# Patient Record
Sex: Female | Born: 1970 | Race: White | Hispanic: No | Marital: Single | Smoking: Current every day smoker
Health system: Southern US, Community
[De-identification: ages and names within clinical notes are randomized; demographics above are authoritative.]

## PROBLEM LIST (undated history)

## (undated) DIAGNOSIS — C4490 Unspecified malignant neoplasm of skin, unspecified: Secondary | ICD-10-CM

## (undated) HISTORY — PX: CHOLECYSTECTOMY: SHX55

## (undated) HISTORY — PX: TUBAL LIGATION: SHX77

## (undated) HISTORY — PX: ROTATOR CUFF REPAIR: SHX139

## (undated) HISTORY — PX: CERVICAL FUSION: SHX112

---

## 2019-10-06 ENCOUNTER — Encounter (HOSPITAL_COMMUNITY): Payer: Self-pay

## 2019-10-06 ENCOUNTER — Emergency Department (HOSPITAL_COMMUNITY)
Admission: EM | Admit: 2019-10-06 | Discharge: 2019-10-06 | Disposition: A | Discharge status: Home / Self Care | Payer: Self-pay | Attending: Emergency Medicine | Admitting: Emergency Medicine

## 2019-10-06 ENCOUNTER — Emergency Department (HOSPITAL_COMMUNITY): Payer: Self-pay

## 2019-10-06 ENCOUNTER — Other Ambulatory Visit: Payer: Self-pay

## 2019-10-06 DIAGNOSIS — F17290 Nicotine dependence, other tobacco product, uncomplicated: Secondary | ICD-10-CM | POA: Insufficient documentation

## 2019-10-06 DIAGNOSIS — K5790 Diverticulosis of intestine, part unspecified, without perforation or abscess without bleeding: Secondary | ICD-10-CM

## 2019-10-06 DIAGNOSIS — D3502 Benign neoplasm of left adrenal gland: Secondary | ICD-10-CM

## 2019-10-06 DIAGNOSIS — N200 Calculus of kidney: Secondary | ICD-10-CM

## 2019-10-06 HISTORY — DX: Unspecified malignant neoplasm of skin, unspecified: C44.90

## 2019-10-06 LAB — COMPREHENSIVE METABOLIC PANEL
ALT: 16 U/L (ref 0–44)
AST: 17 U/L (ref 15–41)
Albumin: 3.5 g/dL (ref 3.5–5.0)
Alkaline Phosphatase: 64 U/L (ref 38–126)
Anion gap: 9 (ref 5–15)
BUN: 10 mg/dL (ref 6–20)
CO2: 23 mmol/L (ref 22–32)
Calcium: 9.2 mg/dL (ref 8.9–10.3)
Chloride: 106 mmol/L (ref 98–111)
Creatinine, Ser: 0.81 mg/dL (ref 0.44–1.00)
GFR calc Af Amer: >60 mL/min (ref >60)
GFR calc non Af Amer: >60 mL/min (ref >60)
Glucose, Bld: 110 mg/dL — ABNORMAL HIGH (ref 70–99)
Potassium: 3.7 mmol/L (ref 3.5–5.1)
Sodium: 138 mmol/L (ref 135–145)
Total Bilirubin: 0.9 mg/dL (ref 0.3–1.2)
Total Protein: 7.2 g/dL (ref 6.5–8.1)

## 2019-10-06 LAB — URINALYSIS, ROUTINE W REFLEX MICROSCOPIC
Bilirubin Urine: NEGATIVE
Glucose, UA: NEGATIVE mg/dL
Ketones, ur: NEGATIVE mg/dL
Nitrite: NEGATIVE
Protein, ur: NEGATIVE mg/dL
Specific Gravity, Urine: 1.004 — ABNORMAL LOW (ref 1.005–1.030)
pH: 6 (ref 5.0–8.0)

## 2019-10-06 LAB — CBC WITH DIFFERENTIAL/PLATELET
Abs Immature Granulocytes: 0.01 10*3/uL (ref 0.00–0.07)
Basophils Absolute: 0 10*3/uL (ref 0.0–0.1)
Basophils Relative: 1 %
Eosinophils Absolute: 0.3 10*3/uL (ref 0.0–0.5)
Eosinophils Relative: 5 %
HCT: 40.2 % (ref 36.0–46.0)
Hemoglobin: 12.8 g/dL (ref 12.0–15.0)
Immature Granulocytes: 0 %
Lymphocytes Relative: 40 %
Lymphs Abs: 2.4 10*3/uL (ref 0.7–4.0)
MCH: 25.4 pg — ABNORMAL LOW (ref 26.0–34.0)
MCHC: 31.8 g/dL (ref 30.0–36.0)
MCV: 79.9 fL — ABNORMAL LOW (ref 80.0–100.0)
Monocytes Absolute: 0.4 10*3/uL (ref 0.1–1.0)
Monocytes Relative: 7 %
Neutro Abs: 2.9 10*3/uL (ref 1.7–7.7)
Neutrophils Relative %: 47 %
Platelets: 292 10*3/uL (ref 150–400)
RBC: 5.03 MIL/uL (ref 3.87–5.11)
RDW: 13.6 % (ref 11.5–15.5)
WBC: 6.1 10*3/uL (ref 4.0–10.5)
nRBC: 0 % (ref 0.0–0.2)

## 2019-10-06 LAB — I-STAT BETA HCG BLOOD, ED (MC, WL, AP ONLY): I-stat hCG, quantitative: <5 m[IU]/mL (ref <5)

## 2019-10-06 LAB — LIPASE, BLOOD: Lipase: 23 U/L (ref 11–51)

## 2019-10-06 MED ORDER — TAMSULOSIN HCL 0.4 MG PO CAPS
0.4000 mg | ORAL_CAPSULE | Freq: Every day | ORAL | Status: DC
  Administered 2019-10-06: 0.4 mg via ORAL
  Filled 2019-10-06: qty 1

## 2019-10-06 MED ORDER — TAMSULOSIN HCL 0.4 MG PO CAPS: 0.4000 mg | ORAL_CAPSULE | Freq: Every day | ORAL | 0 refills | Status: DC

## 2019-10-06 MED ORDER — HYDROCODONE-ACETAMINOPHEN 5-325 MG PO TABS: 1.0000 | ORAL_TABLET | Freq: Four times a day (QID) | ORAL | 0 refills | Status: DC | PRN

## 2019-10-06 MED ORDER — ONDANSETRON 4 MG PO TBDP: 4.0000 mg | ORAL_TABLET | Freq: Three times a day (TID) | ORAL | 0 refills | Status: AC | PRN

## 2019-10-06 MED ORDER — SODIUM CHLORIDE 0.9% FLUSH: 3.0000 mL | Freq: Once | INTRAVENOUS | Status: DC

## 2019-10-06 MED ORDER — ONDANSETRON HCL 4 MG/2ML IJ SOLN
4.0000 mg | Freq: Once | INTRAMUSCULAR | Status: AC
  Administered 2019-10-06: 4 mg via INTRAVENOUS
  Filled 2019-10-06: qty 2

## 2019-10-06 MED ORDER — SODIUM CHLORIDE 0.9 % IV SOLN
1.0000 g | Freq: Once | INTRAVENOUS | Status: AC
  Administered 2019-10-06: 13:00:00 1 g via INTRAVENOUS
  Filled 2019-10-06: qty 10

## 2019-10-06 MED ORDER — MORPHINE SULFATE (PF) 4 MG/ML IV SOLN
4.0000 mg | Freq: Once | INTRAVENOUS | Status: AC
  Administered 2019-10-06: 4 mg via INTRAVENOUS
  Filled 2019-10-06: qty 1

## 2019-10-06 MED ORDER — KETOROLAC TROMETHAMINE 15 MG/ML IJ SOLN
15.0000 mg | Freq: Once | INTRAMUSCULAR | Status: AC
  Administered 2019-10-06: 15 mg via INTRAVENOUS
  Filled 2019-10-06: qty 1

## 2019-10-06 MED ORDER — SULFAMETHOXAZOLE-TRIMETHOPRIM 800-160 MG PO TABS: 1.0000 | ORAL_TABLET | Freq: Two times a day (BID) | ORAL | 0 refills | Status: AC

## 2019-10-06 MED ORDER — OXYCODONE-ACETAMINOPHEN 5-325 MG PO TABS
1.0000 | ORAL_TABLET | Freq: Once | ORAL | Status: AC
  Administered 2019-10-06: 1 via ORAL
  Filled 2019-10-06: qty 1

## 2019-10-06 NOTE — ED Provider Notes (Signed)
[facility] DEPT Provider Note   CSN: [id number] Arrival date & time: 10/06/19  0755     History Chief Complaint  Patient presents with  . Abdominal Pain    Abigail Dixon is a 49 y.o. female presents today with left flank and left lower quadrant pain onset 1 AM today.  Pain began gradually as worsened since that time, has remained severe since 4 AM this morning.  Pain is sharp radiating left flank down towards her groin constant no clear aggravating or alleviating factors and 10/10 in intensity.  She denies history of similar in the past.  Associated symptom of nausea without vomiting, additionally reports urinary frequency.  Denies fever/chills, headache, chest pain/shortness of breath, vomiting, diarrhea, dysuria/hematuria, vaginal bleeding/discharge, concern for STI, numbness/tingling, weakness or any additional concerns. HPI     Past Medical History:  Diagnosis Date  . Skin cancer     There are no problems to display for this patient.   Past Surgical History:  Procedure Laterality Date  . CHOLECYSTECTOMY    . TUBAL LIGATION       OB History   No obstetric history on file.     History reviewed. No pertinent family history.  Social History   Tobacco Use  . Smoking status: Current Every Day Smoker    Types: E-cigarettes  . Smokeless tobacco: Never Used  Substance Use Topics  . Alcohol use: Never  . Drug use: Never    Home Medications Prior to Admission medications   Medication Sig Start Date End Date Taking? Authorizing Provider  ibuprofen (ADVIL) 800 MG tablet Take 800 mg by mouth every 8 (eight) hours as needed for headache or moderate pain.   Yes [provider]  HYDROcodone-acetaminophen (NORCO/VICODIN) 5-325 MG tablet Take 1-2 tablets by mouth every 6 (six) hours as needed. 10/06/19   Nuala Alpha A, PA-C  ondansetron (ZOFRAN ODT) 4 MG disintegrating tablet Take 1 tablet (4 mg total) by mouth every 8 (eight)  hours as needed for up to 3 days for nausea or vomiting. 10/06/19 10/09/19  Nuala Alpha A, PA-C  sulfamethoxazole-trimethoprim (BACTRIM DS) 800-160 MG tablet Take 1 tablet by mouth 2 (two) times daily for 7 days. 10/06/19 10/13/19  Nuala Alpha A, PA-C  tamsulosin (FLOMAX) 0.4 MG CAPS capsule Take 1 capsule (0.4 mg total) by mouth daily. 10/06/19   Deliah Boston, PA-C    Allergies    Patient has no known allergies.  Review of Systems   Review of Systems Ten systems are reviewed and are negative for acute change except as noted in the HPI  Physical Exam Updated Vital Signs BP 117/70   Pulse 80   Temp 97.8 F (36.6 C) (Oral)   Resp 18   Ht 5\' 5"  (1.651 m)   Wt 106.6 kg   SpO2 100%   BMI 39.11 kg/m   Physical Exam Constitutional:      General: She is not in acute distress.    Appearance: Normal appearance. She is well-developed. She is not ill-appearing or diaphoretic.  HENT:     Head: Normocephalic and atraumatic.     Right Ear: External ear normal.     Left Ear: External ear normal.     Nose: Nose normal.  Eyes:     General: Vision grossly intact. Gaze aligned appropriately.     Pupils: Pupils are equal, round, and reactive to light.  Neck:     Trachea: Trachea and phonation normal. No tracheal deviation.  Cardiovascular:  Pulses:          Dorsalis pedis pulses are 2+ on the right side and 2+ on the left side.  Pulmonary:     Effort: Pulmonary effort is normal. No respiratory distress.  Abdominal:     General: There is no distension.     Palpations: Abdomen is soft.     Tenderness: There is abdominal tenderness in the left lower quadrant. There is left CVA tenderness. There is no right CVA tenderness, guarding or rebound.  Genitourinary:    Comments: Deferred by patient Musculoskeletal:        General: Normal range of motion.     Cervical back: Normal range of motion.     Comments: No midline C/T/L spinal tenderness to palpation, no deformity, crepitus, or  step-off noted. No sign of injury to the neck or back.  Feet:     Right foot:     Protective Sensation: 3 sites tested. 3 sites sensed.     Left foot:     Protective Sensation: 3 sites tested. 3 sites sensed.  Skin:    General: Skin is warm and dry.  Neurological:     Mental Status: She is alert.     GCS: GCS eye subscore is 4. GCS verbal subscore is 5. GCS motor subscore is 6.     Comments: Speech is clear and goal oriented, follows commands Major Cranial nerves without deficit, no facial droop Moves extremities without ataxia, coordination intact  Psychiatric:        Behavior: Behavior normal.     ED Results / Procedures / Treatments   Labs (all labs ordered are listed, but only abnormal results are displayed) Labs Reviewed  COMPREHENSIVE METABOLIC PANEL - Abnormal; Notable for the following components:      Result Value   Glucose, Bld 110 (*)    All other components within normal limits  URINALYSIS, ROUTINE W REFLEX MICROSCOPIC - Abnormal; Notable for the following components:   Color, Urine STRAW (*)    Specific Gravity, Urine 1.004 (*)    Hgb urine dipstick MODERATE (*)    Leukocytes,Ua SMALL (*)    Bacteria, UA MANY (*)    All other components within normal limits  CBC WITH DIFFERENTIAL/PLATELET - Abnormal; Notable for the following components:   MCV 79.9 (*)    MCH 25.4 (*)    All other components within normal limits  URINE CULTURE  LIPASE, BLOOD  I-STAT BETA HCG BLOOD, ED ([facility], [facility], [facility] ONLY)    EKG None  Radiology CT Renal Stone Study  Result Date: 10/06/2019 CLINICAL DATA:  49 year old female with history of flank and abdominal pain, most severe on the left side. Evaluate for urinary tract calculus. EXAM: CT ABDOMEN AND PELVIS WITHOUT CONTRAST TECHNIQUE: Multidetector CT imaging of the abdomen and pelvis was performed following the standard protocol without IV contrast. COMPARISON:  No priors. FINDINGS: Lower chest: Unremarkable. Hepatobiliary: No suspicious  cystic or solid hepatic lesions are confidently identified on today's noncontrast CT examination. Status post cholecystectomy. Pancreas: No definite pancreatic mass or peripancreatic fluid collections or inflammatory changes noted on today's noncontrast CT examination. Spleen: Unremarkable. Adrenals/Urinary Tract: 5 mm calculus in the distal left ureter at the left ureterovesicular junction with mild proximal left hydroureteronephrosis. No additional calculi are identified within the collecting system of either kidney, along the course of the right ureter or within the lumen of the urinary bladder. No right-sided hydroureteronephrosis. Bilateral kidneys are otherwise normal in appearance. Urinary bladder is normal  in appearance. 1.6 cm low-attenuation (0 HU) left adrenal nodule compatible with a small adenoma. Right adrenal gland is normal in appearance. Stomach/Bowel: Normal appearance of the stomach. No pathologic dilatation of small bowel or colon. A few scattered colonic diverticulae are noted, particularly in the sigmoid colon, without surrounding inflammatory changes to suggest an acute diverticulitis at this time. Normal appendix. Vascular/Lymphatic: No atherosclerotic calcifications in the abdominal aorta or pelvic vasculature. No lymphadenopathy noted in the abdomen or pelvis. Reproductive: Uterus and ovaries are unremarkable in appearance. Other: No significant volume of ascites.  No pneumoperitoneum. Musculoskeletal: There are no aggressive appearing lytic or blastic lesions noted in the visualized portions of the skeleton. IMPRESSION: 1. 5 mm calculus in the distal left ureter at the left ureterovesicular junction with mild proximal left hydroureteronephrosis indicating mild obstruction at this time. 2. Small 1.6 cm left adrenal adenoma. 3. Mild colonic diverticulosis without evidence of acute diverticulitis at this time. Electronically Signed   By: Vinnie Langton M.D.   On: 10/06/2019 11:05     Procedures Procedures (including critical care time)  Medications Ordered in ED Medications  sodium chloride flush (NS) 0.9 % injection 3 mL (3 mLs Intravenous Not Given 10/06/19 0918)  cefTRIAXone (ROCEPHIN) 1 g in sodium chloride 0.9 % 100 mL IVPB (1 g Intravenous New Bag/Given 10/06/19 1251)  tamsulosin (FLOMAX) capsule 0.4 mg (has no administration in time range)  morphine 4 MG/ML injection 4 mg (4 mg Intravenous Given 10/06/19 0924)  ondansetron (ZOFRAN) injection 4 mg (4 mg Intravenous Given 10/06/19 0926)  ketorolac (TORADOL) 15 MG/ML injection 15 mg (15 mg Intravenous Given 10/06/19 1059)  oxyCODONE-acetaminophen (PERCOCET/ROXICET) 5-325 MG per tablet 1 tablet (1 tablet Oral Given 10/06/19 1251)    ED Course  I have reviewed the triage vital signs and the nursing notes.  Pertinent labs & imaging results that were available during my care of the patient were reviewed by me and considered in my medical decision making (see chart for details).  Clinical Course as of Oct 06 1318  Thu Oct 06, 2019  1202 Dr. Alyson Ingles [facility]; 1g Rocephin, d/c on Bactrim. Follow-up within 1 week.   [BM]    Clinical Course User Index [BM] Gari Crown   MDM Rules/Calculators/A&P                     49 year old female presents today with left flank and left lower quadrant pain, urinary frequency and nausea.  Symptoms began early this morning and she has never experienced similar before.  On initial evaluation she is uncomfortable appearing, no acute distress, she has left CVA tenderness and left lower quadrant tenderness.  We discussed pelvic examination, patient wishes to be evaluated for possible kidney stone and only wants to proceed with pelvic exam if negative CT scan.  I discussed differential today with patient feel is reasonable to respect her wish and will obtain CT renal stone study at this time.  Pain and nausea medication has been ordered. - Lab work reviewed, pregnancy test  negative.  Lipase within normal limits no evidence of pancreatitis.  CBC without leukocytosis to suggest infection and no sign of anemia.  CMP shows slight elevation of glucose otherwise within normal limits no acute electrolyte derangement, kidney injury or elevation of LFTs.  Urinalysis shows moderate hemoglobin, small leukocytes, 6/10 WBCs and many bacteria questionable for infection.  CT renal stone study showed 5 mm stone at the left UVJ with mild hydronephrosis.  Patient was  reassessed states improvement of pain following morphine and Toradol, nausea improved with Zofran.  Vital signs stable.  CT scan reviewed, per radiologist ovaries and uterus appear unremarkable.  Suspect patient's groin pain is referred from her kidney stone today, pelvic exam was deferred.  Low suspicion for GYN etiology of her symptoms today. - 12:02 PM: I discussed the case with urologist Dr. Alyson Ingles who recommended giving patient 1 g IV Rocephin and discharging on Bactrim and follow-up in his office in the next week. - I reevaluated the patient and updated her on urologist plan of care, she is agreeable.  She has no questions or concerns.  She plans to call office today to schedule follow-up appointment.  Patient prescribed tamsulosin to facilitate stone passage, ODT Zofran for nausea, Norco for pain.  Discussed narcotic precautions with patient and she states understanding, PDMP last narcotic rx was November 2019.  It is reasonable to give patient 6 pills Norco for acute kidney stone pain.  Additionally she was given Toradol and advised to avoid further NSAIDs for the next 2 days, she has no history of CKD or gastric ulcers.  Additionally patient was advised of incidental findings on CT scan today in the follow-up PCP.  At this time there does not appear to be any evidence of an acute emergency medical condition and the patient appears stable for discharge with appropriate outpatient follow up. Diagnosis was discussed with  patient who verbalizes understanding of care plan and is agreeable to discharge. I have discussed return precautions with patient who verbalizes understanding of return precautions. Patient encouraged to follow-up with their PCP and urology. All questions answered.  Patient was seen and evaluated by Dr. Ralene Bathe during this visit who agrees with discharge and outpatient follow-up.  Note: Portions of this report may have been transcribed using voice recognition software. Every effort was made to ensure accuracy; however, inadvertent computerized transcription errors may still be present. Final Clinical Impression(s) / ED Diagnoses Final diagnoses:  Kidney stone on left side  Adenoma of left adrenal gland  Diverticulosis    Rx / DC Orders ED Discharge Orders         Ordered    tamsulosin (FLOMAX) 0.4 MG CAPS capsule  Daily     10/06/19 1259    ondansetron (ZOFRAN ODT) 4 MG disintegrating tablet  Every 8 hours PRN     10/06/19 1259    HYDROcodone-acetaminophen (NORCO/VICODIN) 5-325 MG tablet  Every 6 hours PRN     10/06/19 1300    sulfamethoxazole-trimethoprim (BACTRIM DS) 800-160 MG tablet  2 times daily     10/06/19 1302           Gari Crown 10/06/19 1323    Quintella Reichert, MD 10/07/19 1128

## 2019-10-06 NOTE — ED Triage Notes (Signed)
Pt presents with c/o abdominal pain. Pt reports the pain initially started around 1 am. Pt reports the pain is left lower quadrant, radiating into her vaginal area. Pt reports some nausea, no vomiting.

## 2019-10-06 NOTE — Discharge Instructions (Addendum)
You have been diagnosed today with left-sided kidney stone.  At this time there does not appear to be the presence of an emergent medical condition, however there is always the potential for conditions to change. Please read and follow the below instructions.  Please return to the Emergency Department immediately for any new or worsening symptoms or if your symptoms do not improve within 2 days. Please be sure to follow up with your Primary Care Provider within one week regarding your visit today; please call their office to schedule an appointment even if you are feeling better for a follow-up visit. Please call the urologist Dr. Alyson Ingles in your discharge paperwork today to schedule a follow-up appointment within the next 7 days for recheck.  Please take the antibiotic medication Bactrim as prescribed.  Please take the medication Flomax as prescribed to help facilitate passage of a kidney stone.  You may use the medication Zofran as prescribed to help with nausea.  You may use the medication Norco as prescribed to help with severe pain, please do not drive or perform any dangerous activities while taking Norco as it will make you drowsy.  Do not take any sedating medications or drink alcohol with Norco as this will worsen side effects. You have been given an NSAID-containing medication called Toradol today.  Do not take the medications including ibuprofen, Aleve, Advil, naproxen or other NSAID-containing medications for the next 2 days.  Please be sure to drink plenty of water over the next few days. Your CT scan today showed some incidental findings including left adrenal adenoma and diverticulosis.  Please discuss these findings with your primary care provider at your follow-up appointment.  You may view your results in full on your MyChart account and discuss this with your primary care provider.  Get help right away if: You have a fever or chills. You get very bad pain. You get new pain in your  belly (abdomen). You pass out (faint). You cannot pee. You have any new/concerning or worsening of symptoms   Please read the additional information packets attached to your discharge summary.  Do not take your medicine if  develop an itchy rash, swelling in your mouth or lips, or difficulty breathing; call 911 and seek immediate emergency medical attention if this occurs.  Note: Portions of this text may have been transcribed using voice recognition software. Every effort was made to ensure accuracy; however, inadvertent computerized transcription errors may still be present.

## 2019-10-06 NOTE — ED Notes (Signed)
Notified [place], [place] of patients pain 8/10 post morphine admin.

## 2019-10-09 LAB — URINE CULTURE: Culture: 50000 — AB

## 2019-10-10 ENCOUNTER — Telehealth: Payer: Self-pay | Admitting: Emergency Medicine

## 2019-10-10 NOTE — Telephone Encounter (Signed)
Post ED Visit - Positive Culture Follow-up  Culture report reviewed by antimicrobial stewardship pharmacist: [place] Team []  [facility], Pharm.D. []  Heide Guile, Pharm.D., BCPS AQ-ID []  Parks Neptune, Pharm.D., BCPS []  Alycia Rossetti, Pharm.D., BCPS []  [place], Pharm.D., BCPS, AAHIVP []  Legrand Como, Pharm.D., BCPS, AAHIVP []  Salome Arnt, PharmD, BCPS []  Johnnette Gourd, PharmD, BCPS []  Hughes Better, PharmD, BCPS []  Leeroy Cha, PharmD []  Laqueta Linden, PharmD, BCPS []  Albertina Parr, PharmD  [place] Team []  Leodis Sias, PharmD []  Lindell Spar, PharmD []  Royetta Asal, PharmD []  Graylin Shiver, Rph []  Rema Fendt) Glennon Mac, PharmD []  Arlyn Dunning, PharmD []  Netta Cedars, PharmD []  Dia Sitter, PharmD []  Leone Haven, PharmD []  Gretta Arab, PharmD []  Theodis Shove, PharmD []  Peggyann Juba, PharmD []  Reuel Boom, PharmD   Positive urine culture Treated with sulfamethoxazole-trimethoprim , organism sensitive to the same and no further patient follow-up is required at this time.  Hazle Nordmann 10/10/2019, 12:19 PM

## 2019-10-15 ENCOUNTER — Emergency Department (HOSPITAL_COMMUNITY): Payer: Self-pay

## 2019-10-15 ENCOUNTER — Other Ambulatory Visit: Payer: Self-pay

## 2019-10-15 ENCOUNTER — Emergency Department (HOSPITAL_COMMUNITY)
Admission: EM | Admit: 2019-10-15 | Discharge: 2019-10-15 | Disposition: A | Discharge status: Home / Self Care | Payer: Self-pay | Attending: Emergency Medicine | Admitting: Emergency Medicine

## 2019-10-15 DIAGNOSIS — R1032 Left lower quadrant pain: Secondary | ICD-10-CM | POA: Insufficient documentation

## 2019-10-15 DIAGNOSIS — F17290 Nicotine dependence, other tobacco product, uncomplicated: Secondary | ICD-10-CM | POA: Insufficient documentation

## 2019-10-15 DIAGNOSIS — R1031 Right lower quadrant pain: Secondary | ICD-10-CM | POA: Insufficient documentation

## 2019-10-15 LAB — URINALYSIS, ROUTINE W REFLEX MICROSCOPIC
Bilirubin Urine: NEGATIVE
Glucose, UA: NEGATIVE mg/dL
Ketones, ur: NEGATIVE mg/dL
Nitrite: NEGATIVE
Protein, ur: NEGATIVE mg/dL
Specific Gravity, Urine: 1.002 — ABNORMAL LOW (ref 1.005–1.030)
pH: 6 (ref 5.0–8.0)

## 2019-10-15 LAB — COMPREHENSIVE METABOLIC PANEL
ALT: 16 U/L (ref 0–44)
AST: 16 U/L (ref 15–41)
Albumin: 3.7 g/dL (ref 3.5–5.0)
Alkaline Phosphatase: 63 U/L (ref 38–126)
Anion gap: 6 (ref 5–15)
BUN: 11 mg/dL (ref 6–20)
CO2: 23 mmol/L (ref 22–32)
Calcium: 9.3 mg/dL (ref 8.9–10.3)
Chloride: 110 mmol/L (ref 98–111)
Creatinine, Ser: 0.8 mg/dL (ref 0.44–1.00)
GFR calc Af Amer: >60 mL/min (ref >60)
GFR calc non Af Amer: >60 mL/min (ref >60)
Glucose, Bld: 105 mg/dL — ABNORMAL HIGH (ref 70–99)
Potassium: 3.9 mmol/L (ref 3.5–5.1)
Sodium: 139 mmol/L (ref 135–145)
Total Bilirubin: 0.7 mg/dL (ref 0.3–1.2)
Total Protein: 7.4 g/dL (ref 6.5–8.1)

## 2019-10-15 LAB — CBC WITH DIFFERENTIAL/PLATELET
Abs Immature Granulocytes: 0.01 10*3/uL (ref 0.00–0.07)
Basophils Absolute: 0 10*3/uL (ref 0.0–0.1)
Basophils Relative: 0 %
Eosinophils Absolute: 0.3 10*3/uL (ref 0.0–0.5)
Eosinophils Relative: 6 %
HCT: 40.7 % (ref 36.0–46.0)
Hemoglobin: 12.9 g/dL (ref 12.0–15.0)
Immature Granulocytes: 0 %
Lymphocytes Relative: 40 %
Lymphs Abs: 2.4 10*3/uL (ref 0.7–4.0)
MCH: 25.9 pg — ABNORMAL LOW (ref 26.0–34.0)
MCHC: 31.7 g/dL (ref 30.0–36.0)
MCV: 81.6 fL (ref 80.0–100.0)
Monocytes Absolute: 0.4 10*3/uL (ref 0.1–1.0)
Monocytes Relative: 6 %
Neutro Abs: 2.9 10*3/uL (ref 1.7–7.7)
Neutrophils Relative %: 48 %
Platelets: 358 10*3/uL (ref 150–400)
RBC: 4.99 MIL/uL (ref 3.87–5.11)
RDW: 13.8 % (ref 11.5–15.5)
WBC: 6 10*3/uL (ref 4.0–10.5)
nRBC: 0 % (ref 0.0–0.2)

## 2019-10-15 LAB — WET PREP, GENITAL
Clue Cells Wet Prep HPF POC: NONE SEEN
Sperm: NONE SEEN
Trich, Wet Prep: NONE SEEN
Yeast Wet Prep HPF POC: NONE SEEN

## 2019-10-15 LAB — LIPASE, BLOOD: Lipase: 28 U/L (ref 11–51)

## 2019-10-15 MED ORDER — SODIUM CHLORIDE 0.9 % IV SOLN: Freq: Once | INTRAVENOUS | Status: AC

## 2019-10-15 MED ORDER — MORPHINE SULFATE (PF) 4 MG/ML IV SOLN
4.0000 mg | Freq: Once | INTRAVENOUS | Status: AC
  Administered 2019-10-15: 4 mg via INTRAVENOUS
  Filled 2019-10-15: qty 1

## 2019-10-15 MED ORDER — ONDANSETRON HCL 4 MG/2ML IJ SOLN
4.0000 mg | Freq: Once | INTRAMUSCULAR | Status: AC
  Administered 2019-10-15: 4 mg via INTRAVENOUS
  Filled 2019-10-15: qty 2

## 2019-10-15 MED ORDER — DICYCLOMINE HCL 20 MG PO TABS: 20.0000 mg | ORAL_TABLET | Freq: Two times a day (BID) | ORAL | 0 refills | Status: DC

## 2019-10-15 NOTE — ED Provider Notes (Signed)
[facility] DEPT Provider Note   CSN: [id number] Arrival date & time: 10/15/19  1540     History Chief Complaint  Patient presents with  . Flank Pain    Abigail Dixon is a 49 y.o. female.  The history is provided by the patient and the spouse. No language interpreter was used.  Flank Pain This is a new problem. The problem occurs constantly. The problem has been gradually worsening. Associated symptoms include abdominal pain. Pertinent negatives include no chest pain, no headaches and no shortness of breath. Nothing aggravates the symptoms. The symptoms are relieved by narcotics.   Patient is a 49 year old female with no significant past medical history with a surgical history of cholecystectomy and tubal ligation bilaterally.   Patient states that she was seen 3/4 and diagnosed with ureteral stone and given Flomax, pain medication and follow-up with urology.  She states that she was seen by urology on Thursday and saw Dr. Baxter Flattery who refilled her prescriptions for pain medication, Zofran and Flomax.  She was told to return to ED if she had any new or concerning symptoms.  She states that this morning she felt like she was having a crampy, achy, severe pain in her right lower quadrant which is similar to the pain she was having her left side when she had her ureteral stone.  She states it feels like a menstrual cramp and states that it began this morning with a gradual onset.  She denies any hematuria, endorses some mild nausea currently but denies any vomiting.     Past Medical History:  Diagnosis Date  . Skin cancer     There are no problems to display for this patient.   Past Surgical History:  Procedure Laterality Date  . CHOLECYSTECTOMY    . TUBAL LIGATION       OB History   No obstetric history on file.     No family history on file.  Social History   Tobacco Use  . Smoking status: Current Every Day Smoker    Types: E-cigarettes   . Smokeless tobacco: Never Used  Substance Use Topics  . Alcohol use: Never  . Drug use: Never    Home Medications Prior to Admission medications   Medication Sig Start Date End Date Taking? Authorizing Provider  dicyclomine (BENTYL) 20 MG tablet Take 1 tablet (20 mg total) by mouth 2 (two) times daily. 10/15/19   Tedd Sias, PA  HYDROcodone-acetaminophen (NORCO/VICODIN) 5-325 MG tablet Take 1-2 tablets by mouth every 6 (six) hours as needed. 10/06/19   Nuala Alpha A, PA-C  ibuprofen (ADVIL) 800 MG tablet Take 800 mg by mouth every 8 (eight) hours as needed for headache or moderate pain.    [provider]  tamsulosin (FLOMAX) 0.4 MG CAPS capsule Take 1 capsule (0.4 mg total) by mouth daily. 10/06/19   Deliah Boston, PA-C    Allergies    Patient has no known allergies.  Review of Systems   Review of Systems  Constitutional: Negative for chills and fever.  HENT: Negative for congestion.   Eyes: Negative for pain.  Respiratory: Negative for cough and shortness of breath.   Cardiovascular: Negative for chest pain and leg swelling.  Gastrointestinal: Positive for abdominal pain and nausea. Negative for diarrhea and vomiting.  Genitourinary: Positive for flank pain. Negative for dysuria, pelvic pain, vaginal bleeding and vaginal discharge.  Musculoskeletal: Negative for myalgias.  Skin: Negative for rash.  Neurological: Negative for dizziness and headaches.  Physical Exam Updated Vital Signs BP (!) 149/125 (BP Location: Right Arm)   Pulse 78   Temp 98.8 F (37.1 C) (Oral)   Resp 17   Ht 5\' 5"  (1.651 m)   Wt 106.6 kg   SpO2 99%   BMI 39.11 kg/m   Physical Exam Vitals and nursing note reviewed.  Constitutional:      General: She is in acute distress.     Appearance: She is obese. She is not ill-appearing, toxic-appearing or diaphoretic.     Comments: Patient appears acutely uncomfortable.  HENT:     Head: Normocephalic and atraumatic.     Nose:  Nose normal.     Mouth/Throat:     Mouth: Mucous membranes are dry.  Eyes:     General: No scleral icterus. Cardiovascular:     Rate and Rhythm: Normal rate and regular rhythm.     Pulses: Normal pulses.     Heart sounds: Normal heart sounds.  Pulmonary:     Effort: Pulmonary effort is normal. No respiratory distress.     Breath sounds: No wheezing.  Abdominal:     Palpations: Abdomen is soft.     Tenderness: There is abdominal tenderness (Bilateral quadrant abdominal pain with palpation.). There is no right CVA tenderness, left CVA tenderness, guarding or rebound.     Comments: No CVA tenderness.  No rebound, guarding, masses or hernia.  Genitourinary:    Comments: Vulva without lesions or abnormality Vaginal canal without abnormal discharge or lesion Cervix appears normal, is closed No adnexal tenderness or CMT Musculoskeletal:     Cervical back: Normal range of motion.     Right lower leg: No edema.     Left lower leg: No edema.  Skin:    General: Skin is warm and dry.     Capillary Refill: Capillary refill takes less than 2 seconds.  Neurological:     Mental Status: She is alert. Mental status is at baseline.  Psychiatric:        Mood and Affect: Mood normal.        Behavior: Behavior normal.     ED Results / Procedures / Treatments   Labs (all labs ordered are listed, but only abnormal results are displayed) Labs Reviewed  CBC WITH DIFFERENTIAL/PLATELET - Abnormal; Notable for the following components:      Result Value   MCH 25.9 (*)    All other components within normal limits  COMPREHENSIVE METABOLIC PANEL - Abnormal; Notable for the following components:   Glucose, Bld 105 (*)    All other components within normal limits  URINALYSIS, ROUTINE W REFLEX MICROSCOPIC - Abnormal; Notable for the following components:   Color, Urine STRAW (*)    Specific Gravity, Urine 1.002 (*)    Hgb urine dipstick SMALL (*)    Leukocytes,Ua TRACE (*)    Bacteria, UA RARE (*)     All other components within normal limits  WET PREP, GENITAL  LIPASE, BLOOD  PREGNANCY, URINE  I-STAT BETA HCG BLOOD, ED ([facility], [facility], [facility] ONLY)  GC/CHLAMYDIA PROBE AMP ([facility]) NOT AT [facility]    EKG None  Radiology CT ABDOMEN PELVIS WO CONTRAST  Result Date: 10/15/2019 CLINICAL DATA:  Right flank and groin pain. History of kidney stones. EXAM: CT ABDOMEN AND PELVIS WITHOUT CONTRAST TECHNIQUE: Multidetector CT imaging of the abdomen and pelvis was performed following the standard protocol without IV contrast. COMPARISON:  10/06/2019 FINDINGS: Lower chest: Clear lung bases.  Heart normal in size. Hepatobiliary:  No focal liver abnormality is seen. Status post cholecystectomy. No biliary dilatation. Pancreas: Unremarkable. No pancreatic ductal dilatation or surrounding inflammatory changes. Spleen: Normal in size without focal abnormality. Adrenals/Urinary Tract: Stable 11 mm left adrenal nodule, low attenuation consistent with an adenoma. Normal right adrenal gland. Kidneys normal size, orientation and position. No renal masses, stones or hydronephrosis. Ureters normal in course and in caliber. No ureteral stones. Bladder is unremarkable. Stone noted in the distal ureter on the prior CT is no longer present. Stomach/Bowel: Stomach and small bowel are unremarkable. There are scattered colonic diverticula. No diverticulitis, colonic wall thickening or other inflammatory process. Normal appendix visualized. Vascular/Lymphatic: No significant vascular findings are present. No enlarged abdominal or pelvic lymph nodes. Reproductive: Uterus and bilateral adnexa are unremarkable. Other: No abdominal wall hernia or abnormality. No abdominopelvic ascites. Musculoskeletal: No fracture or acute finding. No osteoblastic or osteolytic lesions. IMPRESSION: 1. No acute findings within the abdomen or pelvis. 2. Distal ureteral stone noted on the left on the prior CT is no longer visualized. No current ureteral stones or  obstructive uropathy. No intrarenal stones. 3. Small left adrenal adenoma, stable. 4. Scattered colonic diverticula without evidence of diverticulitis. Electronically Signed   By: Lajean Manes M.D.   On: 10/15/2019 17:07    Procedures Procedures (including critical care time)  Medications Ordered in ED Medications  0.9 %  sodium chloride infusion ( Intravenous Bolus from Bag 10/15/19 1651)  morphine 4 MG/ML injection 4 mg (4 mg Intravenous Given 10/15/19 1651)  ondansetron (ZOFRAN) injection 4 mg (4 mg Intravenous Given 10/15/19 1651)    ED Course  I have reviewed the triage vital signs and the nursing notes.  Pertinent labs & imaging results that were available during my care of the patient were reviewed by me and considered in my medical decision making (see chart for details).  Patient here for right lower quadrant abdominal pain as well as flank pain that began this morning.  She states it feels exactly the same as prior kidney stones that she has had in the past.  She was recently seen for left-sided kidney stone to follow-up with urology.  She is still taking Flomax, narcotic pain medication and Zofran for nausea.  She states that she has been nauseous today and has had decreased oral intake however she has been drinking plenty of water and ate breakfast and lunch.  She has had no episodes of vomiting today.  This patient does have history of kidney stone suspect this is a kidney stone causing her symptoms.  Some concern for the left ureteral stone that she had on prior CT scan 3/4 may have moved to the bladder and obstructed the ureteral outflow.  CT scan ordered to evaluate for this.  Clinical Course as of Oct 15 1814  Sat Oct 15, 2019  1723 CT scan independently viewed by myselfIMPRESSION: 1. No acute findings within the abdomen or pelvis. 2. Distal ureteral stone noted on the left on the prior CT is no longer visualized. No current ureteral stones or obstructive uropathy. No  intrarenal stones. 3. Small left adrenal adenoma, stable. 4. Scattered colonic diverticula without evidence of diverticulitis.   [WF]    Clinical Course User Index [WF] Tedd Sias, [place]   CMP unremarkable.  Lipase normal.  CBC within normal limits.  Urinalysis shows rare bacteria trace leukocytes.  Suspect that-as patient has no urinary frequency or urgency or dysuria at this point--this is a hold over from past ureteral stone as  she does have some small hemoglobin present in urine.  She is currently on Bactrim and has several days left of this antibiotic.  Will recommend that she continue take this antibiotic.  I-STAT beta-hCG has not resulted however patient states that she has bilateral tubal ligation.  She states she would prefer to not have repeat blood work done to assess for pregnancy.  She also states that she has not been sexually active for several months.  I discussed with patient that without a formal test to exclude pregnancy it is concerning for ectopic pregnancy.  She is understanding and defers repeat pregnancy test.  The causes of generalized abdominal pain include but are not limited to AAA, mesenteric ischemia, appendicitis, diverticulitis, DKA, gastritis, gastroenteritis, AMI, nephrolithiasis, pancreatitis, peritonitis, adrenal insufficiency,lead poisoning, iron toxicity, intestinal ischemia, constipation, UTI,SBO/LBO, splenic rupture, biliary disease, IBD, IBS, PUD, or hepatitis. Ectopic pregnancy, ovarian torsion, PID.  In this patient's case I have low suspicion for any of these emergent causes of abdominal pain.  Suspect that her symptoms are secondary to recently passed urinary stone.  Her wet prep and GC chlamydia pending.  Patient has elevated blood pressure on triage vitals however on my reassessment she is 140/90.  She has no chest pain, shortness of breath, nausea or vomiting at this time.  No abdominal pain.  She is well-appearing on reassessment.  Has no abdominal  tenderness on my exam repeat exam.  Pelvic exam unremarkable.  Doubt ovarian torsion.  Doubt ectopic pregnancy.     Patient signed out to Sophia C. PA-C wet prep pending.  Will disposition appropriately once wet prep results.  Anticipate patient will be discharged for follow-up with PCP.  She will continue use Zofran for nausea.  She will use Bentyl for cramping abdominal pain and finish her current course of Bactrim for urinary tract infection.   MDM Rules/Calculators/A&P                      Final Clinical Impression(s) / ED Diagnoses Final diagnoses:  Left lower quadrant abdominal pain  Right lower quadrant abdominal pain    Rx / DC Orders ED Discharge Orders         Ordered    dicyclomine (BENTYL) 20 MG tablet  2 times daily     10/15/19 1803           Pati Gallo [place], [place] 10/15/19 1816    Dorie Rank, MD 10/15/19 2044

## 2019-10-15 NOTE — ED Notes (Signed)
Pt given PO fluids as ordered.  

## 2019-10-15 NOTE — ED Triage Notes (Signed)
Per patient ,she has right side pelvic/flank pain comparable to the pain she had last week on left side. Patient states she was diagnosed with kidney stone and infection last week. Patient states she feels weird, light headed and dizzy.

## 2019-10-18 LAB — GC/CHLAMYDIA PROBE AMP (~~LOC~~) NOT AT ARMC
Chlamydia: NEGATIVE
Neisseria Gonorrhea: NEGATIVE

## 2021-07-18 ENCOUNTER — Encounter (HOSPITAL_COMMUNITY): Payer: Self-pay

## 2021-07-18 ENCOUNTER — Emergency Department (HOSPITAL_COMMUNITY): Payer: Self-pay

## 2021-07-18 ENCOUNTER — Emergency Department (HOSPITAL_COMMUNITY)
Admission: EM | Admit: 2021-07-18 | Discharge: 2021-07-18 | Disposition: A | Discharge status: Home / Self Care | Payer: Self-pay | Attending: Emergency Medicine | Admitting: Emergency Medicine

## 2021-07-18 DIAGNOSIS — F17290 Nicotine dependence, other tobacco product, uncomplicated: Secondary | ICD-10-CM | POA: Insufficient documentation

## 2021-07-18 DIAGNOSIS — Z85828 Personal history of other malignant neoplasm of skin: Secondary | ICD-10-CM | POA: Insufficient documentation

## 2021-07-18 DIAGNOSIS — N938 Other specified abnormal uterine and vaginal bleeding: Secondary | ICD-10-CM | POA: Insufficient documentation

## 2021-07-18 DIAGNOSIS — R102 Pelvic and perineal pain: Secondary | ICD-10-CM | POA: Insufficient documentation

## 2021-07-18 LAB — CBC
HCT: 41 % (ref 36.0–46.0)
Hemoglobin: 13.2 g/dL (ref 12.0–15.0)
MCH: 26.3 pg (ref 26.0–34.0)
MCHC: 32.2 g/dL (ref 30.0–36.0)
MCV: 81.7 fL (ref 80.0–100.0)
Platelets: 335 10*3/uL (ref 150–400)
RBC: 5.02 MIL/uL (ref 3.87–5.11)
RDW: 13.6 % (ref 11.5–15.5)
WBC: 6.8 10*3/uL (ref 4.0–10.5)
nRBC: 0 % (ref 0.0–0.2)

## 2021-07-18 LAB — BASIC METABOLIC PANEL
Anion gap: 7 (ref 5–15)
BUN: 12 mg/dL (ref 6–20)
CO2: 22 mmol/L (ref 22–32)
Calcium: 9.2 mg/dL (ref 8.9–10.3)
Chloride: 108 mmol/L (ref 98–111)
Creatinine, Ser: 0.77 mg/dL (ref 0.44–1.00)
GFR, Estimated: >60 mL/min (ref >60)
Glucose, Bld: 104 mg/dL — ABNORMAL HIGH (ref 70–99)
Potassium: 4.2 mmol/L (ref 3.5–5.1)
Sodium: 137 mmol/L (ref 135–145)

## 2021-07-18 LAB — WET PREP, GENITAL
Clue Cells Wet Prep HPF POC: NONE SEEN
Sperm: NONE SEEN
Trich, Wet Prep: NONE SEEN
WBC, Wet Prep HPF POC: <10 (ref <10)
Yeast Wet Prep HPF POC: NONE SEEN

## 2021-07-18 LAB — TYPE AND SCREEN
ABO/RH(D): O POS
Antibody Screen: NEGATIVE

## 2021-07-18 LAB — I-STAT BETA HCG BLOOD, ED (MC, WL, AP ONLY): I-stat hCG, quantitative: <5 m[IU]/mL (ref <5)

## 2021-07-18 NOTE — ED Provider Notes (Signed)
[facility] DEPT Provider Note   CSN: [id number] Arrival date & time: 07/18/21  1914     History Chief Complaint  Patient presents with   Vaginal Bleeding    Abigail Dixon is a 50 y.o. female.  The history is provided by the patient and medical records. No language interpreter was used.  Vaginal Bleeding  50 year old female with history of tubal ligation, perimenopausal presenting to the ER with complaints of vaginal bleeding.  Patient report for the past week she has had progressive worsening vaginal bleeding.  Initially it was light however for the past few days she is going through multiple heavy pads per hour.  She also now endorse feeling lightheadedness, dizziness, generalized weakness, and mild shortness of breath.  She endorsed lower abdominal cramping mild in severity.  She denies any fever chills productive cough dysuria vaginal discharge or GU changes.  She is not sexually active for the past 2 years.  She has not had any vaginal bleeding for the past 2 years.  She denies tobacco or alcohol use.  She denies any recent trauma.  She is not on any blood thinner medication.  Past Medical History:  Diagnosis Date   Skin cancer     There are no problems to display for this patient.   Past Surgical History:  Procedure Laterality Date   CHOLECYSTECTOMY     TUBAL LIGATION       OB History   No obstetric history on file.     History reviewed. No pertinent family history.  Social History   Tobacco Use   Smoking status: Every Day    Types: E-cigarettes   Smokeless tobacco: Never  Substance Use Topics   Alcohol use: Never   Drug use: Never    Home Medications Prior to Admission medications   Medication Sig Start Date End Date Taking? Authorizing Provider  dicyclomine (BENTYL) 20 MG tablet Take 1 tablet (20 mg total) by mouth 2 (two) times daily. 10/15/19   Tedd Sias, PA  famotidine (PEPCID) 10 MG tablet Take 10 mg by mouth 2  (two) times daily as needed for heartburn or indigestion.    [provider]  HYDROcodone-acetaminophen (NORCO/VICODIN) 5-325 MG tablet Take 1-2 tablets by mouth every 6 (six) hours as needed. 10/06/19   Nuala Alpha A, PA-C  ibuprofen (ADVIL) 800 MG tablet Take 800 mg by mouth every 8 (eight) hours as needed for headache or moderate pain.    [provider]  tamsulosin (FLOMAX) 0.4 MG CAPS capsule Take 1 capsule (0.4 mg total) by mouth daily. 10/06/19   Deliah Boston, PA-C    Allergies    Patient has no known allergies.  Review of Systems   Review of Systems  Genitourinary:  Positive for vaginal bleeding.  All other systems reviewed and are negative.  Physical Exam Updated Vital Signs BP 118/90 (BP Location: Left Arm)    Pulse 77    Temp 98.1 F (36.7 C) (Oral)    Resp 19    Ht 5\' 5"  (1.651 m)    Wt 108.9 kg    SpO2 100%    BMI 39.94 kg/m   Physical Exam Vitals and nursing note reviewed.  Constitutional:      General: She is not in acute distress.    Appearance: She is well-developed.  HENT:     Head: Atraumatic.  Eyes:     Conjunctiva/sclera: Conjunctivae normal.  Cardiovascular:     Rate and Rhythm: Normal rate  and regular rhythm.     Pulses: Normal pulses.     Heart sounds: Normal heart sounds.  Pulmonary:     Effort: Pulmonary effort is normal.  Abdominal:     Palpations: Abdomen is soft.     Tenderness: There is abdominal tenderness (Tenderness to pelvic region on palpation no guarding or rebound tenderness).  Genitourinary:    Comments: Please refer to procedure note Musculoskeletal:     Cervical back: Neck supple.  Skin:    Findings: No rash.  Neurological:     Mental Status: She is alert.  Psychiatric:        Mood and Affect: Mood normal.    ED Results / Procedures / Treatments   Labs (all labs ordered are listed, but only abnormal results are displayed) Labs Reviewed  BASIC METABOLIC PANEL - Abnormal; Notable for the following  components:      Result Value   Glucose, Bld 104 (*)    All other components within normal limits  WET PREP, GENITAL  CBC  RPR  I-STAT BETA HCG BLOOD, ED ([facility], [facility], [facility] ONLY)  TYPE AND SCREEN  GC/CHLAMYDIA PROBE AMP ([facility]) NOT AT [facility]    EKG None  Radiology US Transvaginal Non-OB  Result Date: 07/18/2021 CLINICAL DATA:  Vaginal bleeding x1 week. Postmenopausal/perimenopausal. EXAM: TRANSABDOMINAL AND TRANSVAGINAL ULTRASOUND OF PELVIS DOPPLER ULTRASOUND OF OVARIES TECHNIQUE: Both transabdominal and transvaginal ultrasound examinations of the pelvis were performed. Transabdominal technique was performed for global imaging of the pelvis including uterus, ovaries, adnexal regions, and pelvic cul-de-sac. It was necessary to proceed with endovaginal exam following the transabdominal exam to visualize the endometrium. Color and duplex Doppler ultrasound was utilized to evaluate blood flow to the ovaries. COMPARISON:  None. FINDINGS: Uterus Measurements: 10.4 x 4.8 x 6.4 cm = volume: 166 mL. No fibroids or other mass visualized. Endometrium Thickness: 22 mm.  Thickened/heterogeneous, with color Doppler flow. Right ovary Measurements: 2.7 x 2.5 x 3.6 cm = volume: 12.7 mL. Normal appearance/no adnexal mass. Left ovary Not discretely visualized.  No adnexal mass is seen. Pulsed Doppler evaluation of the right ovary demonstrates dampened but present arterial and venous waveforms. Suboptimal spectral Doppler flow is favored to be secondary to technical factors. Other findings No abnormal free fluid. IMPRESSION: Thickened, heterogeneous endometrium, measuring 22 mm. In the setting of post-menopausal bleeding, endometrial sampling is indicated to exclude carcinoma. If results are benign, sonohysterogram should be considered for focal lesion work-up. (Ref: Radiological Reasoning: Algorithmic Workup of Abnormal Vaginal Bleeding with Endovaginal Sonography and Sonohysterography. AJR 2008; 798:X21-19) Left ovary  is not discretely visualized. No findings suspicious for right ovarian torsion. Electronically Signed   By: Julian Hy M.D.   On: 07/18/2021 22:03   US Pelvis Complete  Result Date: 07/18/2021 CLINICAL DATA:  Vaginal bleeding x1 week. Postmenopausal/perimenopausal. EXAM: TRANSABDOMINAL AND TRANSVAGINAL ULTRASOUND OF PELVIS DOPPLER ULTRASOUND OF OVARIES TECHNIQUE: Both transabdominal and transvaginal ultrasound examinations of the pelvis were performed. Transabdominal technique was performed for global imaging of the pelvis including uterus, ovaries, adnexal regions, and pelvic cul-de-sac. It was necessary to proceed with endovaginal exam following the transabdominal exam to visualize the endometrium. Color and duplex Doppler ultrasound was utilized to evaluate blood flow to the ovaries. COMPARISON:  None. FINDINGS: Uterus Measurements: 10.4 x 4.8 x 6.4 cm = volume: 166 mL. No fibroids or other mass visualized. Endometrium Thickness: 22 mm.  Thickened/heterogeneous, with color Doppler flow. Right ovary Measurements: 2.7 x 2.5 x 3.6 cm = volume: 12.7 mL. Normal  appearance/no adnexal mass. Left ovary Not discretely visualized.  No adnexal mass is seen. Pulsed Doppler evaluation of the right ovary demonstrates dampened but present arterial and venous waveforms. Suboptimal spectral Doppler flow is favored to be secondary to technical factors. Other findings No abnormal free fluid. IMPRESSION: Thickened, heterogeneous endometrium, measuring 22 mm. In the setting of post-menopausal bleeding, endometrial sampling is indicated to exclude carcinoma. If results are benign, sonohysterogram should be considered for focal lesion work-up. (Ref: Radiological Reasoning: Algorithmic Workup of Abnormal Vaginal Bleeding with Endovaginal Sonography and Sonohysterography. AJR 2008; 195:K93-26) Left ovary is not discretely visualized. No findings suspicious for right ovarian torsion. Electronically Signed   By: Julian Hy M.D.   On: 07/18/2021 22:03   [place] Art/Ven Flow Abd Pelv Doppler  Result Date: 07/18/2021 CLINICAL DATA:  Vaginal bleeding x1 week. Postmenopausal/perimenopausal. EXAM: TRANSABDOMINAL AND TRANSVAGINAL ULTRASOUND OF PELVIS DOPPLER ULTRASOUND OF OVARIES TECHNIQUE: Both transabdominal and transvaginal ultrasound examinations of the pelvis were performed. Transabdominal technique was performed for global imaging of the pelvis including uterus, ovaries, adnexal regions, and pelvic cul-de-sac. It was necessary to proceed with endovaginal exam following the transabdominal exam to visualize the endometrium. Color and duplex Doppler ultrasound was utilized to evaluate blood flow to the ovaries. COMPARISON:  None. FINDINGS: Uterus Measurements: 10.4 x 4.8 x 6.4 cm = volume: 166 mL. No fibroids or other mass visualized. Endometrium Thickness: 22 mm.  Thickened/heterogeneous, with color Doppler flow. Right ovary Measurements: 2.7 x 2.5 x 3.6 cm = volume: 12.7 mL. Normal appearance/no adnexal mass. Left ovary Not discretely visualized.  No adnexal mass is seen. Pulsed Doppler evaluation of the right ovary demonstrates dampened but present arterial and venous waveforms. Suboptimal spectral Doppler flow is favored to be secondary to technical factors. Other findings No abnormal free fluid. IMPRESSION: Thickened, heterogeneous endometrium, measuring 22 mm. In the setting of post-menopausal bleeding, endometrial sampling is indicated to exclude carcinoma. If results are benign, sonohysterogram should be considered for focal lesion work-up. (Ref: Radiological Reasoning: Algorithmic Workup of Abnormal Vaginal Bleeding with Endovaginal Sonography and Sonohysterography. AJR 2008; 712:W58-09) Left ovary is not discretely visualized. No findings suspicious for right ovarian torsion. Electronically Signed   By: Julian Hy M.D.   On: 07/18/2021 22:03    Procedures Pelvic exam  Date/Time: 07/18/2021 8:43 PM Performed  by: Domenic Moras, PA-C Authorized by: Domenic Moras, PA-C  Consent: Verbal consent obtained. Risks and benefits: risks, benefits and alternatives were discussed Consent given by: patient Comments: Chaperone present during exam.  No inguinal lymphadenopathy or inguinal hernia noted.  Normal external genitalia.  No significant discomfort with speculum insertion.  Small amount of blood noted in vaginal vault.  Close cervical os free of lesion or rash.  On bimanual examination mild left adnexal tenderness.  No obvious mass.  Exam limited due to large body habitus.     Medications Ordered in ED Medications - No data to display  ED Course  I have reviewed the triage vital signs and the nursing notes.  Pertinent labs & imaging results that were available during my care of the patient were reviewed by me and considered in my medical decision making (see chart for details).    MDM Rules/Calculators/A&P                         BP 110/80    Pulse 73    Temp 98.1 F (36.7 C) (Oral)    Resp 17    Ht 5'  5" (1.651 m)    Wt 108.9 kg    SpO2 97%    BMI 39.94 kg/m      Final Clinical Impression(s) / ED Diagnoses Final diagnoses:  Dysfunctional uterine bleeding    Rx / DC Orders ED Discharge Orders     None      This is a patient in periMenopausal that is currently having vaginal bleeding for the past week.  Labs obtained shown normal hemoglobin of 13.2.  Pelvic exam with small amount of blood noted in vaginal vault.  Due to abnormal vaginal bleeding, will obtain transvaginal ultrasound for further assessment.  10:14 PM Transvaginal ultrasound obtained showing thickened, heterogeneous endometrium measuring approximately 22 mm.  The setting of postmenopausal bleeding, endometrial sampling is indicated to exclude carcinoma.  I discussed finding with patient and recommend outpatient follow-up with OB/GYN for further assessment.   Domenic Moras, PA-C 07/18/21 2225    Lajean Saver, MD 07/18/21  2239

## 2021-07-18 NOTE — Discharge Instructions (Signed)
You have been evaluated for abnormal vaginal bleeding.  Fortunately your blood count is within normal limit.  An ultrasound was obtained today and it shows thickening of your endometrium.  You will need to follow up with OBGYN for further assessment.  You will likely need endometrial sampling (a procedure that can be done outpatient).  Return If you have any concerns.

## 2021-07-18 NOTE — ED Triage Notes (Signed)
Patient arrives from home with complaint of vaginal bleeding x 1 week, with significant increase in the last few days. Pt reports not having a period for the last 2 years. Endorses weakness, dizziness, & SOB.

## 2021-07-19 LAB — GC/CHLAMYDIA PROBE AMP (~~LOC~~) NOT AT ARMC
Chlamydia: NEGATIVE
Comment: NEGATIVE
Comment: NORMAL
Neisseria Gonorrhea: NEGATIVE

## 2021-07-19 LAB — RPR: RPR Ser Ql: NONREACTIVE

## 2021-09-04 ENCOUNTER — Other Ambulatory Visit (HOSPITAL_COMMUNITY)
Admission: RE | Admit: 2021-09-04 | Discharge: 2021-09-04 | Disposition: A | Discharge status: Home / Self Care | Payer: Medicaid Other | Source: Ambulatory Visit | Attending: Family Medicine | Admitting: Family Medicine

## 2021-09-04 ENCOUNTER — Encounter: Payer: Self-pay | Admitting: Family Medicine

## 2021-09-04 ENCOUNTER — Ambulatory Visit (INDEPENDENT_AMBULATORY_CARE_PROVIDER_SITE_OTHER): Payer: Self-pay | Admitting: Family Medicine

## 2021-09-04 ENCOUNTER — Other Ambulatory Visit: Payer: Self-pay

## 2021-09-04 VITALS — BP 118/84 | HR 60 | Ht 65.0 in | Wt 235.0 lb

## 2021-09-04 DIAGNOSIS — Z124 Encounter for screening for malignant neoplasm of cervix: Secondary | ICD-10-CM

## 2021-09-04 DIAGNOSIS — N939 Abnormal uterine and vaginal bleeding, unspecified: Secondary | ICD-10-CM | POA: Diagnosis present

## 2021-09-04 DIAGNOSIS — Z1231 Encounter for screening mammogram for malignant neoplasm of breast: Secondary | ICD-10-CM

## 2021-09-04 LAB — POCT PREGNANCY, URINE: Preg Test, Ur: NEGATIVE

## 2021-09-04 NOTE — Assessment & Plan Note (Signed)
Postmenopausal bleeding, EMB obtained, will call with results. Also due for pap, collected today. Ordered for mammogram.

## 2021-09-04 NOTE — Progress Notes (Signed)
Pt states had AUB x 3 weeks, went to ED on 12/15 and then stopped on 07/22/21. LMP was 2 years ago.

## 2021-09-04 NOTE — Progress Notes (Signed)
° ° °  GYNECOLOGY CLINIC ENDOMETRIAL BIOPSY PROCEDURE NOTE  Abigail Dixon is a y 51 y.o. [phone] here for endometrial biopsy for abnormal uterine bleeding.  Discussed nature of the procedure and risks and benefits.  Pregnancy test: neg No results found for: PREGTESTUR  No Known Allergies  Patient given informed consent, signed copy in the chart, time out was performed.    The patient was placed in the lithotomy position and the cervix brought into view with sterile speculum.  Cervix cleansed x 2 with betadine swabs. The uterus was sounded for depth of 9 cm. A pipelle was introduced to into the uterus, suction created,  and an endometrial sample was obtained. All equipment was removed and accounted for.  The patient tolerated the procedure well.   Patient given post procedure instructions.  Per patient preference she will be notified of results by telephone.  Clarnce Flock, MD/MPH Attending Family Medicine Physician, [facility] for [facility], [place]

## 2021-09-04 NOTE — Progress Notes (Signed)
GYNECOLOGY OFFICE VISIT NOTE  History:   Abigail Dixon is a 51 y.o. No obstetric history on file. here today for ED follow up.  Patient went to [facility] ED on 07/18/2021 At that time reported vaginal bleeding Relatively benign exam, pelvic swabs negative for infection, hgb normal at 13.2 Pelvic US showed thickened endometrium measuring 22 mm  Today reports bleeding had stopped since that visit but started again about 2 weeks ago Reports that prior to first episode of bleeding she had not had a period for 2 years No intercourse for past 2-3 years No significant smoking history but does vape Mother passed away from cancer of the esophagus, lung, brain, she was a smoker. She was 24 Paternal aunt died at 23 of breast cancer Father passed away from cancer as well, had prostate and liver issues, he was 75 Sister had a hysterectomy for prolapse No other family hx of colon, uterine, breast, or ovarian cancer that she is aware of    Health Maintenance Due  Topic Date Due   HIV Screening  Never done   Hepatitis C Screening  Never done   TETANUS/TDAP  Never done   PAP SMEAR-Modifier  Never done   COLONOSCOPY (Pts 45-13yrs Insurance coverage will need to be confirmed)  Never done   MAMMOGRAM  10/27/2018   Zoster Vaccines- Shingrix (1 of 2) Never done    Past Medical History:  Diagnosis Date   Skin cancer     Past Surgical History:  Procedure Laterality Date   CERVICAL FUSION     2018, 2021   CHOLECYSTECTOMY     ROTATOR CUFF REPAIR Right    TUBAL LIGATION      The following portions of the patient's history were reviewed and updated as appropriate: allergies, current medications, past family history, past medical history, past social history, past surgical history and problem list.   Health Maintenance:   Last pap: No results found for: DIAGPAP, HPV, HPVHIGH Collected today  Last mammogram:  Ordered today    Review of Systems:  Pertinent items noted in HPI and remainder of  comprehensive ROS otherwise negative.  Physical Exam:  BP 118/84    Pulse 60    Ht 5\' 5"  (1.651 m)    Wt 235 lb (106.6 kg)    LMP  (LMP Unknown)    BMI 39.11 kg/m  CONSTITUTIONAL: Well-developed, well-nourished female in no acute distress.  HEENT:  Normocephalic, atraumatic. External right and left ear normal. No scleral icterus.  NECK: Normal range of motion, supple, no masses noted on observation SKIN: No rash noted. Not diaphoretic. No erythema. No pallor. MUSCULOSKELETAL: Normal range of motion. No edema noted. NEUROLOGIC: Alert and oriented to person, place, and time. Normal muscle tone coordination.  PSYCHIATRIC: Normal mood and affect. Normal behavior. Normal judgment and thought content. RESPIRATORY: Effort normal, no problems with respiration noted ABDOMEN: No masses noted. No other overt distention noted.   PELVIC:  normal  Labs and Imaging No results found for this or any previous visit (from the past 168 hour(s)). No results found.    Assessment and Plan:   Problem List Items Addressed This Visit       Genitourinary   Abnormal uterine bleeding (AUB) - Primary    Postmenopausal bleeding, EMB obtained, will call with results. Also due for pap, collected today. Ordered for mammogram.       Relevant Orders   Surgical pathology( [facility]/ [facility])   Other Visit Diagnoses  Screening for cervical cancer       Relevant Orders   Cytology - PAP( [facility])   Encounter for screening mammogram for malignant neoplasm of breast       Relevant Orders   MM Digital Screening       Routine preventative health maintenance measures emphasized. Please refer to After Visit Summary for other counseling recommendations.   Return for pending results.    Total face-to-face time with patient: 20 minutes.  Over 50% of encounter was spent on counseling and coordination of care.   Clarnce Flock, MD/MPH Attending Family Medicine Physician, [facility]  for [facility], [place]

## 2021-09-05 ENCOUNTER — Encounter: Payer: Self-pay | Admitting: *Deleted

## 2021-09-06 LAB — CYTOLOGY - PAP
Comment: NEGATIVE
Diagnosis: NEGATIVE
High risk HPV: NEGATIVE

## 2021-09-06 LAB — SURGICAL PATHOLOGY

## 2021-09-09 NOTE — Progress Notes (Signed)
Benign EMB and normal pap after post menopausal bleeding Left message on her phone saying results were good but no further details and I would send her a mychart message

## 2021-10-03 ENCOUNTER — Encounter: Payer: Medicaid Other | Admitting: Obstetrics and Gynecology

## 2021-10-21 ENCOUNTER — Other Ambulatory Visit: Payer: Self-pay | Admitting: Family Medicine

## 2021-10-21 DIAGNOSIS — Z1231 Encounter for screening mammogram for malignant neoplasm of breast: Secondary | ICD-10-CM

## 2021-10-22 ENCOUNTER — Ambulatory Visit
Admission: RE | Admit: 2021-10-22 | Discharge: 2021-10-22 | Disposition: A | Discharge status: Home / Self Care | Payer: Medicaid Other | Source: Ambulatory Visit

## 2021-10-22 DIAGNOSIS — Z1231 Encounter for screening mammogram for malignant neoplasm of breast: Secondary | ICD-10-CM

## 2021-10-24 ENCOUNTER — Other Ambulatory Visit: Payer: Self-pay | Admitting: Family Medicine

## 2021-10-24 DIAGNOSIS — R928 Other abnormal and inconclusive findings on diagnostic imaging of breast: Secondary | ICD-10-CM

## 2021-11-13 ENCOUNTER — Ambulatory Visit
Admission: RE | Admit: 2021-11-13 | Discharge: 2021-11-13 | Disposition: A | Discharge status: Home / Self Care | Payer: Medicaid Other | Source: Ambulatory Visit | Attending: Family Medicine | Admitting: Family Medicine

## 2021-11-13 DIAGNOSIS — R928 Other abnormal and inconclusive findings on diagnostic imaging of breast: Secondary | ICD-10-CM

## 2021-11-14 ENCOUNTER — Encounter: Payer: Self-pay | Admitting: Family Medicine

## 2021-11-14 DIAGNOSIS — R928 Other abnormal and inconclusive findings on diagnostic imaging of breast: Secondary | ICD-10-CM | POA: Insufficient documentation

## 2022-04-15 ENCOUNTER — Other Ambulatory Visit: Payer: Self-pay | Admitting: Family Medicine

## 2022-04-15 DIAGNOSIS — N6320 Unspecified lump in the left breast, unspecified quadrant: Secondary | ICD-10-CM

## 2022-04-15 DIAGNOSIS — N6310 Unspecified lump in the right breast, unspecified quadrant: Secondary | ICD-10-CM

## 2022-05-28 ENCOUNTER — Other Ambulatory Visit: Payer: Medicaid Other

## 2022-07-11 ENCOUNTER — Other Ambulatory Visit: Payer: Medicaid Other

## 2022-08-08 ENCOUNTER — Other Ambulatory Visit: Payer: Medicaid Other

## 2022-10-26 IMAGING — MG DIGITAL DIAGNOSTIC BILAT W/ TOMO W/ CAD
8 of 14 series · 8 of 40 positions shown · non-contrast
Comparison: [id number]

CLINICAL DATA: Patient returns after screening study for evaluation
possible masses in the RIGHT breast and possible mass in the LEFT
breast.



[R CC synth-2D]
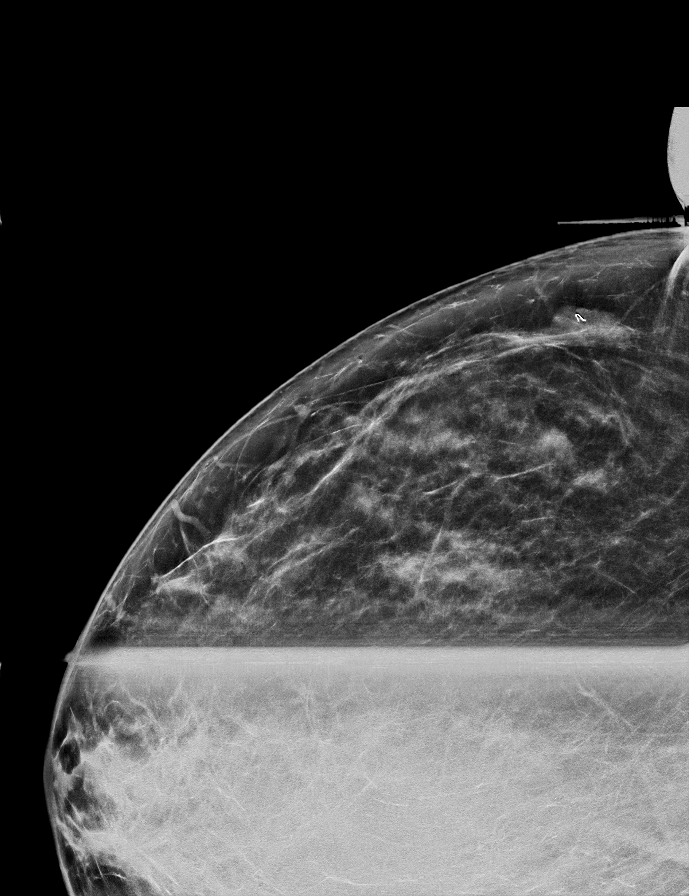

[R MLO synth-2D (1 of 2)]
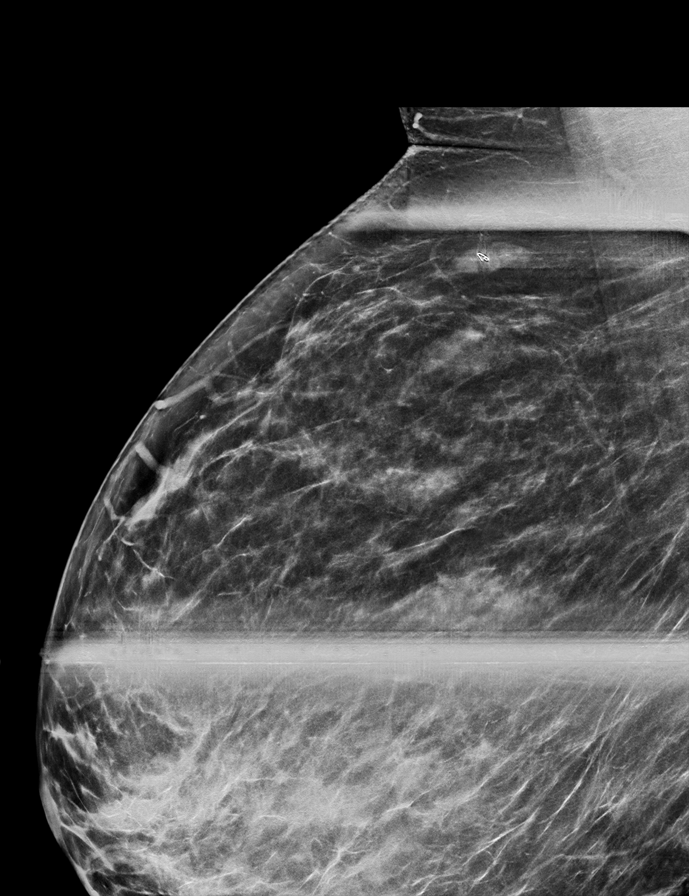

[L MLO synth-2D]
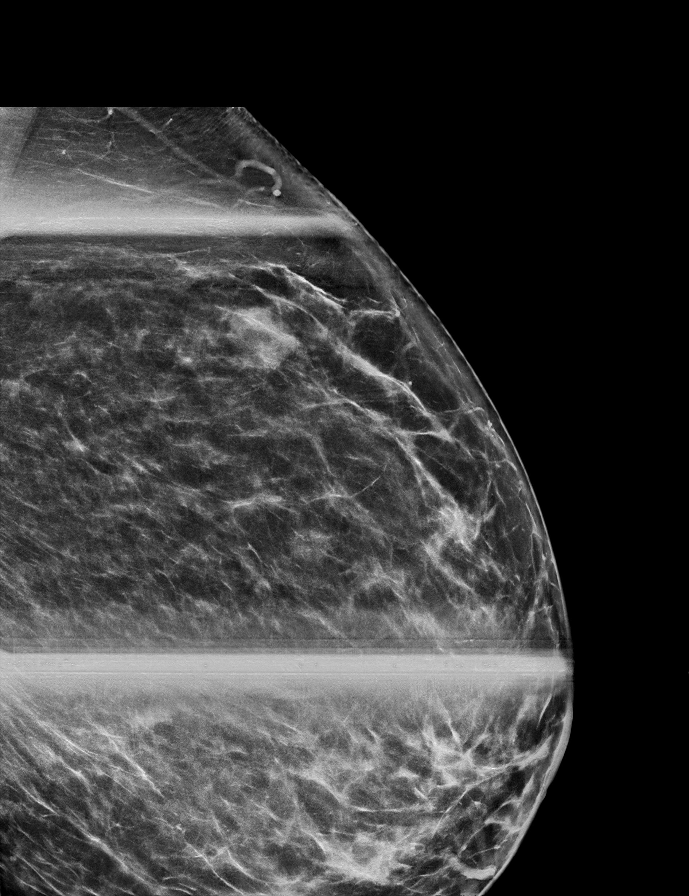

[R MLO synth-2D (2 of 2)]
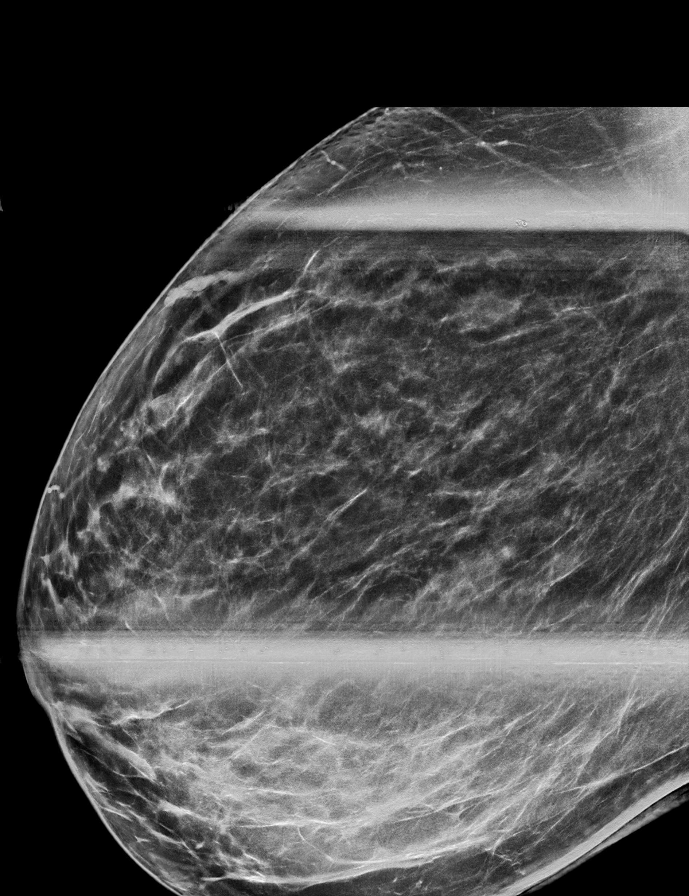

[R ML synth-2D]
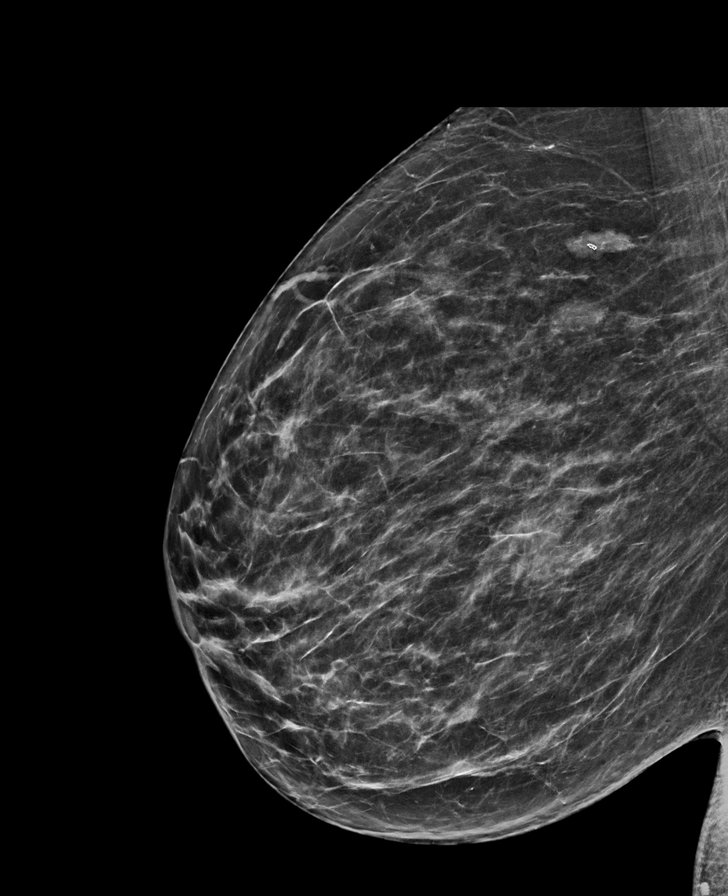

[L CC synth-2D]
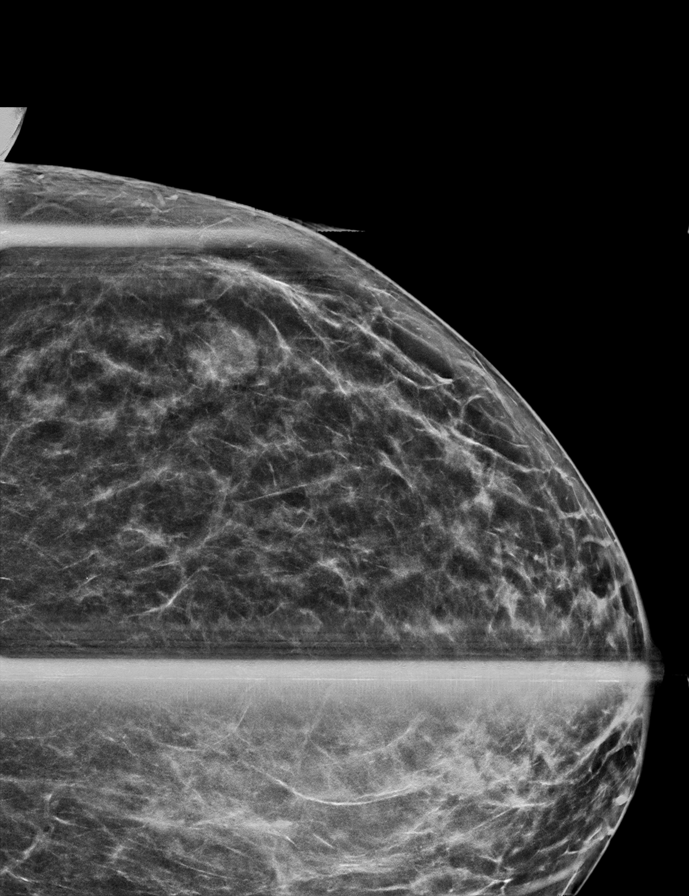

[L ML synth-2D]
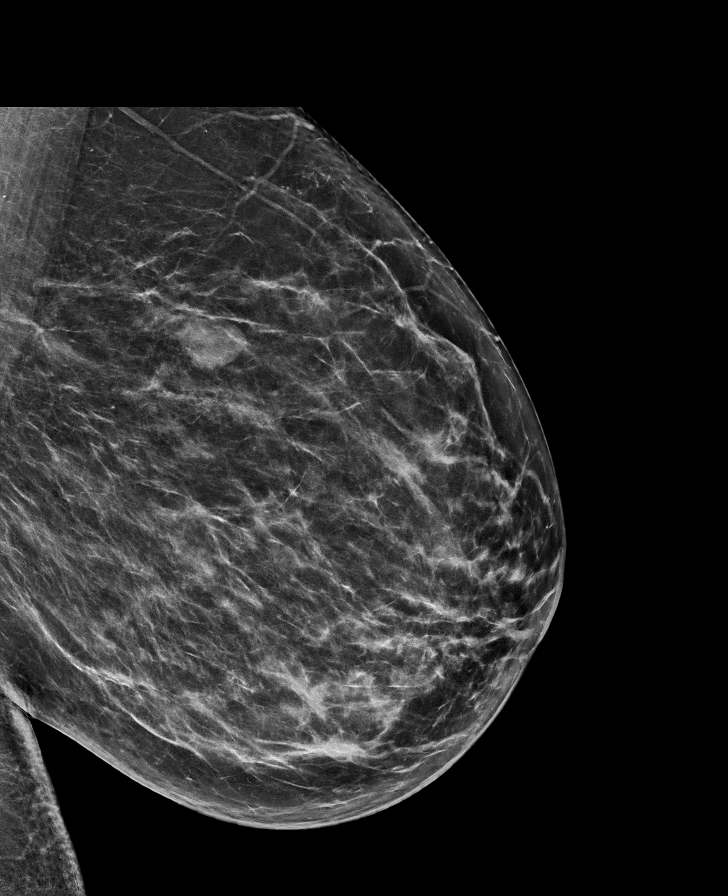

[R MLO tomo · tomo slice 37/74.0]
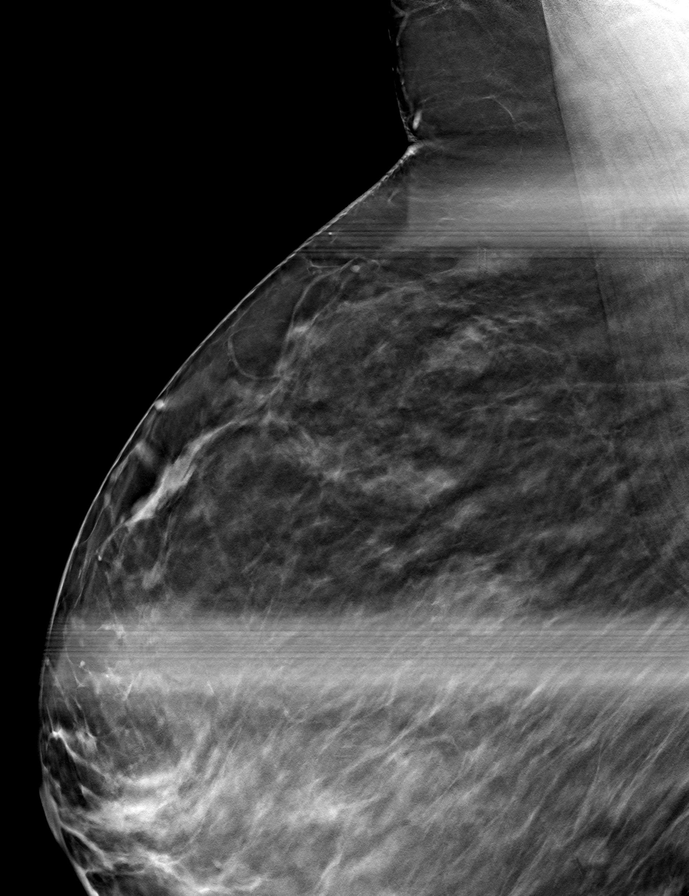

[8 of 40 positions shown; findings below may reference images not displayed]

ACR Breast Density Category b: There are scattered areas of
fibroglandular density.
FINDINGS: RIGHT BREAST:

Mammogram: Additional 2-D and 3-D images are performed. These views
confirm presence of a circumscribed oval mass in the UPPER-OUTER
QUADRANT of the RIGHT breast. The mass originally questioned in the
LATERAL central portion of the RIGHT breast effaces on additional
views. In addition, there is a previously biopsied mass with tissue
marker clip in the UPPER-OUTER QUADRANT of the RIGHT breast. This
mass is smaller compared to prior studies. Mammographic images were
processed with CAD.

Ultrasound: Targeted ultrasound is performed, showing a
circumscribed hypoechoic parallel mass in the 10 o'clock location
RIGHT breast 16 centimeters from the nipple measuring 1.0 by 1.9 x
0.8 centimeters. Tissue marker clip artifact is present.

In the 10 o'clock location 12 centimeters from nipple, a similar
appearing mass measures 1.5 x 0.7 x 0.9 centimeters.

Evaluation of the RIGHT axilla is negative for adenopathy.

LEFT BREAST:

Mammogram: Additional 2-D and 3-D images are performed. These views
confirm presence of a circumscribed partially obscured mass in the
UPPER-OUTER QUADRANT breast. Mammographic images were processed with
CAD.

Ultrasound: Targeted ultrasound is performed, showing circumscribed
oval hypoechoic parallel mass in the 2 o'clock location of the LEFT
breast 12 centimeters nipple. Mass is 1.5 x 0.9 by 1.6 centimeters.
No internal blood flow identified with Doppler evaluation.
IMPRESSION: 1. Probably benign mass in the 10 o'clock location of the RIGHT
breast 12 centimeters from nipple warranting follow-up.
2. Previously biopsied mass in the 10 o'clock location of RIGHT
breast 16 centimeters from nipple, smaller compared with prior
studies. benign mass in the 2 o'clock location of the LEFT breast
3. Probably benign mass in the 2 o'clock location of the LEFT
breast, warranting follow-up.

RECOMMENDATION:
1. Recommend RIGHT breast ultrasound in 6 months to follow the mass
in the 10 o'clock location 12 centimeters from the nipple.
2. Recommend LEFT breast ultrasound in 6 months.

I have discussed the findings and recommendations with the patient.
If applicable, a reminder letter will be sent to the patient
regarding the next appointment.

BI-RADS CATEGORY  3: Probably benign.
